# Patient Record
Sex: Female | Born: 1995 | Race: Black or African American | Hispanic: No | Marital: Single | Smoking: Former smoker
Health system: Southern US, Community
[De-identification: ages and names within clinical notes are randomized; demographics above are authoritative.]

## PROBLEM LIST (undated history)

## (undated) DIAGNOSIS — F32A Depression, unspecified: Secondary | ICD-10-CM

## (undated) DIAGNOSIS — I10 Essential (primary) hypertension: Secondary | ICD-10-CM

## (undated) DIAGNOSIS — F419 Anxiety disorder, unspecified: Secondary | ICD-10-CM

## (undated) HISTORY — PX: OTHER SURGICAL HISTORY: SHX169

## (undated) HISTORY — PX: TONSILLECTOMY: SUR1361

---

## 2020-01-30 ENCOUNTER — Ambulatory Visit
Admission: RE | Admit: 2020-01-30 | Discharge: 2020-01-30 | Disposition: A | Discharge status: Home / Self Care | Payer: PRIVATE HEALTH INSURANCE | Source: Ambulatory Visit | Attending: Emergency Medicine | Admitting: Emergency Medicine

## 2020-01-30 ENCOUNTER — Other Ambulatory Visit: Payer: Self-pay

## 2020-01-30 VITALS — BP 117/76 | HR 63 | Temp 99.0°F | Resp 17 | Ht 66.0 in | Wt 200.0 lb

## 2020-01-30 DIAGNOSIS — Z113 Encounter for screening for infections with a predominantly sexual mode of transmission: Secondary | ICD-10-CM | POA: Insufficient documentation

## 2020-01-30 HISTORY — DX: Depression, unspecified: F32.A

## 2020-01-30 HISTORY — DX: Anxiety disorder, unspecified: F41.9

## 2020-01-30 HISTORY — DX: Essential (primary) hypertension: I10
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
## 2020-01-30 NOTE — Discharge Instructions (Signed)
Vaginal self swab obtained  Declines HIV/ syphilis testing today We will follow up with you regarding the results of your test If tests are positive, please abstain from sexual activity until you and your partner(s) are treated Follow up with PCP as needed Return here or go to ER if you have any new or worsening symptoms fever, chills, nausea, vomiting, abdominal pain, vaginal discharge, vaginal odor, sores, etc..Marland Kitchen

## 2020-01-30 NOTE — ED Provider Notes (Signed)
[facility]   [id number] 01/30/20 Arrival Time: 1832   ZC:HYIFOYD FOR STD  SUBJECTIVE:  Emily Garrett is a 24 y.o. female who presents requesting STI screening.  Currently asymptomatic.  Partner treated for gonorrhea.  Last unprotected sexual encounter 5 days ago.  Sexually active with 1 female partner over the past 6 months.  Denies similar symptoms in the past.  Denies fever, chills, nausea, vomiting, abdominal or pelvic pain, urinary symptoms, vaginal itching, vaginal odor, vaginal bleeding, dyspareunia, vaginal rashes or lesions.   Patient's last menstrual period was 01/17/2020.  ROS: As per HPI.  All other pertinent ROS negative.     Past Medical History:  Diagnosis Date  . Anxiety and depression   . Hypertension    Past Surgical History:  Procedure Laterality Date  . TONSILLECTOMY    . wisdom teeth     Allergies  Allergen Reactions  . Penicillins Hives   No current facility-administered medications on file prior to encounter.   Current Outpatient Medications on File Prior to Encounter  Medication Sig Dispense Refill  . ALPRAZolam (XANAX) 0.25 MG tablet Take 0.25 mg by mouth 2 (two) times daily as needed for anxiety.    . norelgestromin-ethinyl estradiol (ORTHO EVRA) 150-35 MCG/24HR transdermal patch Place 1 patch onto the skin once a week.     Social History   Socioeconomic History  . Marital status: Single    Spouse name: Not on file  . Number of children: Not on file  . Years of education: Not on file  . Highest education level: Not on file  Occupational History  . Not on file  Tobacco Use  . Smoking status: Current Every Day Smoker    Packs/day: 0.50    Types: Cigarettes  . Smokeless tobacco: Never Used  Substance and Sexual Activity  . Alcohol use: Yes    Comment: 1-2 drinks / day   . Drug use: Never  . Sexual activity: Not on file  Other Topics Concern  . Not on file  Social History Narrative  . Not on file   Social Determinants of  Health   Financial Resource Strain:   . Difficulty of Paying Living Expenses: Not on file  Food Insecurity:   . Worried About Programme researcher, broadcasting/film/video in the Last Year: Not on file  . Ran Out of Food in the Last Year: Not on file  Transportation Needs:   . Lack of Transportation (Medical): Not on file  . Lack of Transportation (Non-Medical): Not on file  Physical Activity:   . Days of Exercise per Week: Not on file  . Minutes of Exercise per Session: Not on file  Stress:   . Feeling of Stress : Not on file  Social Connections:   . Frequency of Communication with Friends and Family: Not on file  . Frequency of Social Gatherings with Friends and Family: Not on file  . Attends Religious Services: Not on file  . Active Member of Clubs or Organizations: Not on file  . Attends Banker Meetings: Not on file  . Marital Status: Not on file  Intimate Partner Violence:   . Fear of Current or Ex-Partner: Not on file  . Emotionally Abused: Not on file  . Physically Abused: Not on file  . Sexually Abused: Not on file   No family history on file.  OBJECTIVE:  Vitals:   01/30/20 1855 01/30/20 1857  BP: 117/76   Pulse: 63   Resp: 17   Temp:  99 F (37.2 C)   TempSrc: Oral   SpO2: 98%   Weight:  200 lb (90.7 kg)  Height:  5\' 6"  (1.676 m)     General appearance: alert, NAD, appears stated age Head: NCAT Throat: lips, mucosa, and tongue normal; teeth and gums normal Lungs: CTA bilaterally without adventitious breath sounds Heart: regular rate and rhythm.   Back: no CVA tenderness Abdomen: soft, non-tender; bowel sounds normal; no guarding  GU: deferred Skin: warm and dry Psychological:  Alert and cooperative. Normal mood and affect.  LABS:  No results found for this or any previous visit.  Labs Reviewed  CERVICOVAGINAL ANCILLARY ONLY    ASSESSMENT & PLAN:  1. Screening examination for venereal disease    Pending: Labs Reviewed  CERVICOVAGINAL ANCILLARY ONLY     Vaginal self swab obtained  Declines HIV/ syphilis testing today We will follow up with you regarding the results of your test If tests are positive, please abstain from sexual activity until you and your partner(s) are treated Follow up with PCP as needed Return here or go to ER if you have any new or worsening symptoms fever, chills, nausea, vomiting, abdominal pain, vaginal discharge, vaginal odor, sores, etc...  Reviewed expectations re: course of current medical issues. Questions answered. Outlined signs and symptoms indicating need for more acute intervention. Patient verbalized understanding. After Visit Summary given.       , PA-C 01/30/20 1913

## 2020-02-01 ENCOUNTER — Other Ambulatory Visit: Payer: Self-pay

## 2020-02-01 ENCOUNTER — Telehealth (HOSPITAL_COMMUNITY): Payer: Self-pay | Admitting: Emergency Medicine

## 2020-02-01 ENCOUNTER — Ambulatory Visit
Admission: EM | Admit: 2020-02-01 | Discharge: 2020-02-01 | Disposition: A | Discharge status: Home / Self Care | Payer: No Typology Code available for payment source

## 2020-02-01 LAB — CERVICOVAGINAL ANCILLARY ONLY
Bacterial Vaginitis (gardnerella): POSITIVE — AB
Candida Glabrata: NEGATIVE
Candida Vaginitis: NEGATIVE
Chlamydia: NEGATIVE
Comment: NEGATIVE
Comment: NEGATIVE
Comment: NEGATIVE
Comment: NEGATIVE
Comment: NEGATIVE
Comment: NORMAL
Neisseria Gonorrhea: POSITIVE — AB
Trichomonas: NEGATIVE

## 2020-02-01 MED ORDER — METRONIDAZOLE 500 MG PO TABS: 500.0000 mg | ORAL_TABLET | Freq: Two times a day (BID) | ORAL | 0 refills | Status: DC

## 2020-02-01 NOTE — ED Triage Notes (Signed)
Pt allergic to penicillin and will need to go to [facility] location for gentamycin. Pt advised she will gi in am

## 2020-02-02 ENCOUNTER — Ambulatory Visit (HOSPITAL_COMMUNITY)
Admission: EM | Admit: 2020-02-02 | Discharge: 2020-02-02 | Disposition: A | Discharge status: Home / Self Care | Payer: No Typology Code available for payment source | Attending: Urgent Care | Admitting: Urgent Care

## 2020-02-02 ENCOUNTER — Encounter (HOSPITAL_COMMUNITY): Payer: Self-pay | Admitting: Emergency Medicine

## 2020-02-02 ENCOUNTER — Other Ambulatory Visit: Payer: Self-pay

## 2020-02-02 DIAGNOSIS — A549 Gonococcal infection, unspecified: Secondary | ICD-10-CM

## 2020-02-02 MED ORDER — AZITHROMYCIN 250 MG PO TABS
2000.0000 mg | ORAL_TABLET | Freq: Once | ORAL | Status: AC
  Administered 2020-02-02: 2000 mg via ORAL

## 2020-02-02 MED ORDER — GENTAMICIN SULFATE 40 MG/ML IJ SOLN
INTRAMUSCULAR | Status: AC
  Filled 2020-02-02: qty 2

## 2020-02-02 MED ORDER — AZITHROMYCIN 250 MG PO TABS
ORAL_TABLET | ORAL | Status: AC
  Filled 2020-02-02: qty 8

## 2020-02-02 MED ORDER — GENTAMICIN SULFATE 40 MG/ML IJ SOLN
INTRAMUSCULAR | Status: AC
  Filled 2020-02-02: qty 4

## 2020-02-02 MED ORDER — GENTAMICIN SULFATE 40 MG/ML IJ SOLN
240.0000 mg | Freq: Once | INTRAMUSCULAR | Status: AC
  Administered 2020-02-02: 240 mg via INTRAMUSCULAR

## 2020-02-02 NOTE — ED Triage Notes (Signed)
Patient presents for gentamycin and azithromycin treatment. She is aware of what side effects to look out for for an allergic reaction. All of patients questions were answered. Pt informed no sexual intercourse x 7 days.

## 2020-02-02 NOTE — ED Provider Notes (Addendum)
I was notified that patient is here for gentamicin for treatment of gonorrhea as she suffered an allergic reaction to ceftriaxone at a different site.  Placed an order as a nurse visit for 240 mg IM gentamicin.  Per CDC guidelines also needs 2 g azithromycin as a single dose.      Wallis Bamberg, PA-C 02/02/20 1945

## 2020-03-14 ENCOUNTER — Other Ambulatory Visit: Payer: Self-pay

## 2020-03-14 ENCOUNTER — Encounter (HOSPITAL_COMMUNITY): Payer: Self-pay | Admitting: Emergency Medicine

## 2020-03-14 ENCOUNTER — Emergency Department (HOSPITAL_COMMUNITY)
Admission: EM | Admit: 2020-03-14 | Discharge: 2020-03-15 | Disposition: A | Discharge status: Home / Self Care | Payer: PRIVATE HEALTH INSURANCE | Attending: Emergency Medicine | Admitting: Emergency Medicine

## 2020-03-14 DIAGNOSIS — S60212A Contusion of left wrist, initial encounter: Secondary | ICD-10-CM | POA: Insufficient documentation

## 2020-03-14 DIAGNOSIS — Z23 Encounter for immunization: Secondary | ICD-10-CM | POA: Diagnosis not present

## 2020-03-14 DIAGNOSIS — S0993XA Unspecified injury of face, initial encounter: Secondary | ICD-10-CM | POA: Diagnosis present

## 2020-03-14 DIAGNOSIS — T7411XA Adult physical abuse, confirmed, initial encounter: Secondary | ICD-10-CM | POA: Insufficient documentation

## 2020-03-14 DIAGNOSIS — S0083XA Contusion of other part of head, initial encounter: Secondary | ICD-10-CM | POA: Diagnosis not present

## 2020-03-14 DIAGNOSIS — S01112A Laceration without foreign body of left eyelid and periocular area, initial encounter: Secondary | ICD-10-CM | POA: Insufficient documentation

## 2020-03-14 DIAGNOSIS — I10 Essential (primary) hypertension: Secondary | ICD-10-CM | POA: Diagnosis not present

## 2020-03-14 DIAGNOSIS — Z87891 Personal history of nicotine dependence: Secondary | ICD-10-CM | POA: Diagnosis not present
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
## 2020-03-15 ENCOUNTER — Emergency Department (HOSPITAL_COMMUNITY): Payer: PRIVATE HEALTH INSURANCE

## 2020-03-15 MED ORDER — ONDANSETRON 8 MG PO TBDP
8.0000 mg | ORAL_TABLET | Freq: Once | ORAL | Status: AC
  Administered 2020-03-15: 8 mg via ORAL
  Filled 2020-03-15: qty 1

## 2020-03-15 MED ORDER — ACETAMINOPHEN 325 MG PO TABS
650.0000 mg | ORAL_TABLET | Freq: Once | ORAL | Status: AC
  Administered 2020-03-15: 650 mg via ORAL
  Filled 2020-03-15: qty 2

## 2020-03-15 MED ORDER — TETANUS-DIPHTH-ACELL PERTUSSIS 5-2.5-18.5 LF-MCG/0.5 IM SUSY
0.5000 mL | PREFILLED_SYRINGE | Freq: Once | INTRAMUSCULAR | Status: AC
  Administered 2020-03-15: 0.5 mL via INTRAMUSCULAR
  Filled 2020-03-15: qty 0.5

## 2020-03-15 NOTE — ED Notes (Signed)
Patient discharged to home.  All discharge instructions reviewed.  Patient able to verbalize understanding via teachback method.  Ambulatory out of ED.  
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
## 2020-03-15 NOTE — ED Notes (Signed)
poc  preg test negative at this time

## 2020-03-15 NOTE — Discharge Instructions (Addendum)
Apply ice to painful or swollen areas.  Ice should be applied for 30 minutes at a time, 4 times a day.  Take acetaminophen and/or ibuprofen as needed for pain.

## 2020-03-15 NOTE — ED Notes (Signed)
Dermabond glue dry and intact to L eyebrow.  Application performed by provider.
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
